# Patient Record
Sex: Male | Born: 1975 | Hispanic: No | Marital: Married | Smoking: Never smoker
Health system: Southern US, Community
[De-identification: ages and names within clinical notes are randomized; demographics above are authoritative.]

## PROBLEM LIST (undated history)

## (undated) ENCOUNTER — Emergency Department (HOSPITAL_BASED_OUTPATIENT_CLINIC_OR_DEPARTMENT_OTHER): Admission: EM | Payer: 59

---

## 2004-04-12 ENCOUNTER — Encounter: Admission: RE | Admit: 2004-04-12 | Discharge: 2004-04-12 | Payer: Self-pay | Admitting: Specialist

## 2014-10-14 ENCOUNTER — Ambulatory Visit (INDEPENDENT_AMBULATORY_CARE_PROVIDER_SITE_OTHER): Payer: BLUE CROSS/BLUE SHIELD | Admitting: Podiatry

## 2014-10-14 ENCOUNTER — Encounter: Payer: Self-pay | Admitting: Podiatry

## 2014-10-14 VITALS — BP 118/69 | HR 58 | Ht 65.0 in | Wt 172.0 lb

## 2014-10-14 DIAGNOSIS — L03032 Cellulitis of left toe: Secondary | ICD-10-CM | POA: Diagnosis not present

## 2014-10-14 DIAGNOSIS — D1800 Hemangioma unspecified site: Secondary | ICD-10-CM | POA: Insufficient documentation

## 2014-10-14 DIAGNOSIS — D1809 Hemangioma of other sites: Secondary | ICD-10-CM | POA: Diagnosis not present

## 2014-10-14 MED ORDER — CEPHALEXIN 500 MG PO CAPS: 500.0000 mg | ORAL_CAPSULE | Freq: Three times a day (TID) | ORAL | Status: AC

## 2014-10-14 NOTE — Progress Notes (Signed)
Subjective:  39 year old male presents stating that he has draining left great toe x 3 months. He was given a course of antibiotics by PCP. The drainage has not stopped. 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   Review of Systems - Negative except chief complaints.  Objective: Dermatologic: No open skin lesion visible. Serosanguinous draining toe nail from distal end nail bed left great toe. Vascular: All pedal pulses are palpable. No edema or erythema noted. No temperature changes noted. Orthopedic: Positive of excess sagittal plane motion of the first metatarsals bilateral. Limited dorsiflexion of the great toe upon loading of forefoot.  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 Hallux limitus bilateral. Hypermobile first ray bilateral.   Plan: Reviewed clinical findings and available treatment options. Patient elects to have the lesion removed today. Patient understand that nail plate also need be removed in order to excise the lesion that is under the nail bed.   Procedures done: 1. Removal of  nail plate left hallux. 2. Excision subungual growth left hallux, 0.3 x 1.2cm.  Local used: Total 5 ml of 50/50 mixture 0.5% Marcaine plain and 1% Xylocaine with epinephrine. Betadine prep used prior to incision.  Suture: 5/0 Nylon.  Dressing used with Amerigel ointment and compression dressing.  Keep the dressing intact and dry.  Return in one week.

## 2014-10-14 NOTE — Patient Instructions (Signed)
Left great toe lesion excised with nail avulsion. Keep the dressing dry till next week. Return in one week.

## 2014-10-17 ENCOUNTER — Encounter: Payer: Self-pay | Admitting: Podiatry

## 2014-10-17 ENCOUNTER — Ambulatory Visit (INDEPENDENT_AMBULATORY_CARE_PROVIDER_SITE_OTHER): Payer: BLUE CROSS/BLUE SHIELD | Admitting: Podiatry

## 2014-10-17 DIAGNOSIS — Z9889 Other specified postprocedural states: Secondary | ICD-10-CM

## 2014-10-17 NOTE — Progress Notes (Signed)
3 days post op. Patient came in because his dressing got soaked with blood. Wound examined. Much bleeding in dressing. Incision site is closed with no active bleeding. Wound cleansed with H2O2. Amerigel dressing applied. Home care instruction given.  Keep next appointment.

## 2014-10-17 NOTE — Patient Instructions (Signed)
Post op wound left great toe dressing changed. May take shower with dressing on. Remove dressing and dry well after each shower. Cleanse the wound with Hydrogen Peroxide or with Betadine solution and dry well.  Cover with antibiotic ointment dressing during the day.  May leave it open at night if it is not tender or has no drainage.

## 2014-10-21 ENCOUNTER — Encounter: Payer: BLUE CROSS/BLUE SHIELD | Admitting: Podiatry

## 2014-10-22 ENCOUNTER — Encounter: Payer: Self-pay | Admitting: Podiatry

## 2014-10-22 ENCOUNTER — Ambulatory Visit (INDEPENDENT_AMBULATORY_CARE_PROVIDER_SITE_OTHER): Payer: BLUE CROSS/BLUE SHIELD | Admitting: Podiatry

## 2014-10-22 DIAGNOSIS — Z9889 Other specified postprocedural states: Secondary | ICD-10-CM

## 2014-10-22 NOTE — Patient Instructions (Signed)
Post op wound healing. Sutures removed. Keep it covered with antibiotic dressing. Return in one week.

## 2014-10-22 NOTE — Progress Notes (Signed)
Post op left great toe following excision of subungual granuloma. Pathologic report reviewed. No abnormal malignant cell found. Toe is sore and difficult to walk. Wound is healing well. Sutures removed. Compression bandage applied. Home care instruction given.

## 2014-10-28 ENCOUNTER — Encounter: Payer: Self-pay | Admitting: Podiatry

## 2014-10-28 ENCOUNTER — Ambulatory Visit (INDEPENDENT_AMBULATORY_CARE_PROVIDER_SITE_OTHER): Payer: BLUE CROSS/BLUE SHIELD | Admitting: Podiatry

## 2014-10-28 DIAGNOSIS — Z9889 Other specified postprocedural states: Secondary | ICD-10-CM

## 2014-10-28 NOTE — Patient Instructions (Signed)
Post surgical nail bed healed well and dry. Return as needed.

## 2014-10-28 NOTE — Progress Notes (Signed)
Post surgical nail bed, excision of glomus tumor. Healed well. Nail bed is dry and clean. Return as needed.

## 2014-11-25 ENCOUNTER — Encounter: Payer: Self-pay | Admitting: *Deleted

## 2015-06-26 ENCOUNTER — Encounter: Payer: Self-pay | Admitting: Podiatry

## 2015-06-26 ENCOUNTER — Ambulatory Visit: Payer: BLUE CROSS/BLUE SHIELD | Admitting: Podiatry

## 2015-06-26 ENCOUNTER — Ambulatory Visit (INDEPENDENT_AMBULATORY_CARE_PROVIDER_SITE_OTHER): Payer: 59 | Admitting: Podiatry

## 2015-06-26 VITALS — BP 135/81 | HR 68

## 2015-06-26 DIAGNOSIS — M21969 Unspecified acquired deformity of unspecified lower leg: Secondary | ICD-10-CM | POA: Diagnosis not present

## 2015-06-26 DIAGNOSIS — M216X9 Other acquired deformities of unspecified foot: Secondary | ICD-10-CM | POA: Diagnosis not present

## 2015-06-26 DIAGNOSIS — M722 Plantar fascial fibromatosis: Secondary | ICD-10-CM

## 2015-06-26 DIAGNOSIS — B351 Tinea unguium: Secondary | ICD-10-CM

## 2015-06-26 NOTE — Patient Instructions (Signed)
Seen for abnormal nail on left great toe and heel pain. Deformed fungal infected nail from old surgical site debrided. May benefit from custom orthotics. Return for orthotic preparation.

## 2015-06-26 NOTE — Progress Notes (Signed)
Subjective: 40 year old male presents complaining of left big toe has thickness and discoloration, which is the same toe we done excision of glomus tumor from nail bed.  The left heel has sharp pain off and on.  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   Objective: Thick and dark abnormal nail at distal end of left great toe nail.  Neurovascular status are within normal.  Orthopedic: Elevated first ray bilateral. Forefoot varus bilateral. Healed left great toe lesion.  Assessment: S/P Excision glomus tumor left great toe nail bed. Onychomycosis left great toe. Forefoot varus with elevated first ray bilateral. Plantar fasciitis left.  Plan: Reviewed clinical findings and available treatment options. Left great toe nail debrided. May benefit from custom orthotics for left heel pain.

## 2015-07-07 ENCOUNTER — Encounter: Payer: Self-pay | Admitting: Podiatry

## 2015-07-07 ENCOUNTER — Ambulatory Visit (INDEPENDENT_AMBULATORY_CARE_PROVIDER_SITE_OTHER): Payer: 59 | Admitting: Podiatry

## 2015-07-07 VITALS — BP 128/71 | HR 68

## 2015-07-07 DIAGNOSIS — M216X9 Other acquired deformities of unspecified foot: Secondary | ICD-10-CM

## 2015-07-07 DIAGNOSIS — M21969 Unspecified acquired deformity of unspecified lower leg: Secondary | ICD-10-CM

## 2015-07-07 DIAGNOSIS — M722 Plantar fascial fibromatosis: Secondary | ICD-10-CM | POA: Diagnosis not present

## 2015-07-07 NOTE — Patient Instructions (Signed)
Both feet casted for orthotics. 

## 2015-07-07 NOTE — Progress Notes (Signed)
Subjective: 40 year old male presents complaining of left big toe has thickness and discoloration, which is the same toe we done excision of glomus tumor from nail bed.  The left heel has sharp pain off and on.  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  Objective: Thick and dark abnormal nail at distal end of left great toe nail.  Neurovascular status are within normal.  Orthopedic: Elevated first ray bilateral. Forefoot varus bilateral. Healed left great toe lesion.  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  Assessment: S/P Excision glomus tumor left great toe nail bed. Forefoot varus with elevated first ray bilateral. Plantar fasciitis left.  Plan: Reviewed clinical findings and available treatment options. Both feet casted for orthotics.

## 2015-10-21 ENCOUNTER — Ambulatory Visit: Payer: 59 | Admitting: Podiatry

## 2015-11-19 ENCOUNTER — Ambulatory Visit (INDEPENDENT_AMBULATORY_CARE_PROVIDER_SITE_OTHER): Payer: 59 | Admitting: Podiatry

## 2015-11-19 ENCOUNTER — Encounter: Payer: Self-pay | Admitting: Podiatry

## 2015-11-19 DIAGNOSIS — M79672 Pain in left foot: Secondary | ICD-10-CM

## 2015-11-19 DIAGNOSIS — B351 Tinea unguium: Secondary | ICD-10-CM

## 2015-11-19 NOTE — Patient Instructions (Signed)
Foot is doing better. Orthotics are helping while on feet at work. Pain while at home without shoes. Advised to get well supported sandals. Thick dystrophic mycotic nail on left great toe debrided. Return as needed.

## 2015-11-19 NOTE — Progress Notes (Signed)
Subjective: One month orthotic follow up. Foot is doing better. Orthotics are helping while on feet at work. Pain while at home without shoes. Patient points out painful left great toe with nail deformity.  Objective: Thick dystrophic nail with dorsal ridge at distal middle with fungal debris under nail plate left great toe.  Assessment: Improved plantar fasciitis with orthotic treatment. Onychomycosis with deformity left great toe.  Plan: Advised to get well supported sandals to wear at home. Thick dystrophic mycotic nail on left great toe debrided. Return as needed.

## 2016-01-17 ENCOUNTER — Encounter (HOSPITAL_COMMUNITY): Payer: Self-pay

## 2016-01-17 ENCOUNTER — Emergency Department (HOSPITAL_COMMUNITY)
Admission: EM | Admit: 2016-01-17 | Discharge: 2016-01-18 | Disposition: A | Discharge status: Home / Self Care | Payer: 59 | Attending: Emergency Medicine | Admitting: Emergency Medicine

## 2016-01-17 ENCOUNTER — Emergency Department (HOSPITAL_COMMUNITY): Payer: 59

## 2016-01-17 DIAGNOSIS — J40 Bronchitis, not specified as acute or chronic: Secondary | ICD-10-CM | POA: Diagnosis not present

## 2016-01-17 DIAGNOSIS — R55 Syncope and collapse: Secondary | ICD-10-CM | POA: Insufficient documentation

## 2016-01-17 LAB — CBG MONITORING, ED: Glucose-Capillary: 139 mg/dL — ABNORMAL HIGH (ref 65–99)

## 2016-01-17 MED ORDER — HYDROCODONE-HOMATROPINE 5-1.5 MG/5ML PO SYRP
5.0000 mL | ORAL_SOLUTION | Freq: Once | ORAL | Status: AC
  Administered 2016-01-18: 5 mL via ORAL
  Filled 2016-01-17: qty 5

## 2016-01-17 NOTE — ED Provider Notes (Signed)
[facility] DEPT Provider Note   CSN: [id number] Arrival date & time: 01/17/16  2321  By signing my name below, I, Chad Estes, attest that this documentation has been prepared under the direction and in the presence of [employer], PA-C. Electronically Signed: Dora Estes, Scribe. 01/17/2016. 11:47 PM.  History   Chief Complaint Chief Complaint  Patient presents with  . Loss of Consciousness    The history is provided by the patient and medical records. No language interpreter was used.     HPI Comments: Chad Estes is a 40 y.o. male brought in by family who presents to the Emergency Department complaining of a witnessed syncopal episode that occurred about an hour and a half ago. Patient reports he started having a "coughing fit" while leaning against a wall and suddenly lost consciousness; wife reports he landed on his left side and was unconscious for about 20-30 seconds. Pt reports he does not remember losing consciousness. He notes some mild facial, left shoulder, and left upper dental pain since the fall. He states he has had a persistent dry cough for about a week and has been coughing very hard. He notes some difficulty breathing when he has "coughing fits." He denies SOB when he is not coughing. No h/o syncope in the past. NKDA. He further denies CP, chest tightness, dysuria, seizures, tremors, fever, chills, nausea, vomiting, diarrhea, epistaxis, ear pain, or any other associated symptoms.  History reviewed. No pertinent past medical history.  Patient Active Problem List   Diagnosis Date Noted  . Status post nail surgery 10/17/2014  . Glomus tumor 10/14/2014  . Paronychia of great toe, left 10/14/2014    History reviewed. No pertinent surgical history.     Home Medications    Prior to Admission medications   Medication Sig Start Date End Date Taking? Authorizing Provider  cephALEXin (KEFLEX) 500 MG capsule Take 1 capsule (500 mg total) by mouth 3  (three) times daily. 10/14/14   Myeong O Sheard, DPM  HYDROcodone-homatropine (HYCODAN) 5-1.5 MG/5ML syrup Take 5 mLs by mouth every 6 (six) hours as needed for cough. 01/18/16   Audyn Dimercurio, PA-C  predniSONE (DELTASONE) 20 MG tablet Take 2 tablets (40 mg total) by mouth daily. 01/18/16   Jarrett Soho Gelisa Tieken, PA-C    Family History History reviewed. No pertinent family history.  Social History Social History  Substance Use Topics  . Smoking status: Never Smoker  . Smokeless tobacco: Never Used  . Alcohol use Not on file     Allergies   Patient has no known allergies.   Review of Systems Review of Systems  Constitutional: Negative for chills and fever.  HENT: Positive for dental problem (left upper). Negative for ear pain and nosebleeds.        Positive for facial pain.  Respiratory: Positive for cough and shortness of breath (only with cough). Negative for chest tightness.   Gastrointestinal: Negative for nausea and vomiting.  Musculoskeletal: Positive for myalgias (left shoulder).  Neurological: Positive for syncope (one episode). Negative for tremors and seizures.  All other systems reviewed and are negative.    Physical Exam Updated Vital Signs BP 163/100 (BP Location: Left Arm)   Pulse 77   Temp 98.5 F (36.9 C) (Oral)   Resp 17   Ht 5\' 7"  (1.702 m)   Wt 173 lb (78.5 kg)   SpO2 100%   BMI 27.10 kg/m   Physical Exam  Constitutional: He appears well-developed and well-nourished. No distress.  Awake, alert, nontoxic appearance  HENT:  Head: Normocephalic.  Mouth/Throat: Oropharynx is clear and moist. No oropharyngeal exudate.  Large contusion over the left zygomatic arch with tenderness to palpation of the left upper teeth. No visible fractures. No open wounds. No epistaxis.   Eyes: Conjunctivae and EOM are normal. Pupils are equal, round, and reactive to light. No scleral icterus.  Neck: Normal range of motion. Neck supple.  Cardiovascular: Normal  rate, regular rhythm and intact distal pulses.   Pulmonary/Chest: Effort normal. No respiratory distress. He has decreased breath sounds. He has no wheezes.  Equal chest expansion. Dry, hacking cough.  Abdominal: Soft. Bowel sounds are normal. He exhibits no mass. There is no tenderness. There is no rebound and no guarding.  Musculoskeletal: Normal range of motion. He exhibits no edema.  Left shoulder: decreased ROM due to pain. Tenderness to palpation over the [facility] joint with ecchymosis and swelling at the site. FROM of the left elbow and left wrist. Sensation intact to the left upper extremity. Strength 5/5 in LUE including grip strength.  Neurological: He is alert. No sensory deficit ( in BUE and BLE with normal touch). Gait normal. GCS eye subscore is 4. GCS verbal subscore is 5. GCS motor subscore is 6.  Speech is clear and goal oriented Moves extremities without ataxia Strength 5/5 in the BUE and BLE with c/o pain in the left shoulder during testing  Skin: Skin is warm and dry. He is not diaphoretic.  Psychiatric: He has a normal mood and affect.  Nursing note and vitals reviewed.    ED Treatments / Results  Labs (all labs ordered are listed, but only abnormal results are displayed) Labs Reviewed  BASIC METABOLIC PANEL - Abnormal; Notable for the following:       Result Value   Glucose, Bld 144 (*)    Calcium 8.8 (*)    All other components within normal limits  CBG MONITORING, ED - Abnormal; Notable for the following:    Glucose-Capillary 139 (*)    All other components within normal limits  CBC   ED ECG REPORT   Date: 01/18/2016  Rate: 80  Rhythm: normal sinus rhythm  QRS Axis: normal  Intervals: normal  ST/T Wave abnormalities: early repolarization  Conduction Disutrbances:none  Narrative Interpretation: NSR - ECG reviewed by Dr. Regenia Skeeter and myself  Old EKG Reviewed: none available  I have personally reviewed the EKG tracing and agree with the computerized printout  as noted.   Radiology Dg Chest 2 View  Result Date: 01/18/2016 CLINICAL DATA:  Patient passed out after coughing fit this evening EXAM: CHEST  2 VIEW COMPARISON:  04/12/2004 CXR FINDINGS: The heart size and mediastinal contours are within normal limits. Mild interstitial prominence suggesting bronchitic change. No alveolar consolidation, CHF nor effusion. The visualized skeletal structures are unremarkable. IMPRESSION: Mild increase in interstitial prominence suggesting bronchitic change. Electronically Signed   By: Ashley Royalty M.D.   On: 01/18/2016 00:35   Dg Shoulder Left  Result Date: 01/18/2016 CLINICAL DATA:  Pain after syncope EXAM: LEFT SHOULDER - 2+ VIEW COMPARISON:  None. FINDINGS: There is no evidence of fracture or dislocation. There is no evidence of arthropathy or other focal bone abnormality. Soft tissues are unremarkable. Visualized ribs and lung are without acute appearing abnormality. IMPRESSION: No acute osseous abnormality of the left shoulder. Electronically Signed   By: Ashley Royalty M.D.   On: 01/18/2016 00:37   Ct Maxillofacial Wo Contrast  Result Date: 01/18/2016 CLINICAL DATA:  Fall with impact to left  face. EXAM: CT MAXILLOFACIAL WITHOUT CONTRAST TECHNIQUE: Multidetector CT imaging of the maxillofacial structures was performed. Multiplanar CT image reconstructions were also generated. A small metallic BB was placed on the right temple in order to reliably differentiate right from left. COMPARISON:  None. FINDINGS: Osseous: --Complex facial fracture types: No LeFort, zygomaticomaxillary complex or nasoorbitoethmoidal fracture. --Simple fracture types: There is mild irregularity of the anterior wall of the left maxillary sinus, but this is in the location of symmetric bilateral nutrient foramina. Given that there is no other cortical discontinuity or displacement, this morphology is unlikely to indicate a true fracture. --Mandible, hard palate and teeth: No acute abnormality.  Orbits: The globes appear intact. Normal appearance of the intra- and extraconal fat. Symmetric extraocular muscles. Sinuses: There is intermediate attenuation material within the maxillary sinuses there is partial opacification of the right ethmoid air cells. Frontal sinuses clear. Minimal right sphenoid mucosal thickening. Mastoids are clear. Soft tissues: There is left facial soft tissue swelling involving the left nasolabial fold and left cheek. Limited intracranial: Normal. IMPRESSION: Left facial swelling without acute facial fracture. Electronically Signed   By: Ulyses Jarred M.D.   On: 01/18/2016 00:49    Procedures Procedures (including critical care time)  DIAGNOSTIC STUDIES: Oxygen Saturation is 100% on RA, normal by my interpretation.    COORDINATION OF CARE: 11:57 PM Discussed treatment plan with pt at bedside and pt agreed to plan.  Medications Ordered in ED Medications  albuterol (PROVENTIL HFA;VENTOLIN HFA) 108 (90 Base) MCG/ACT inhaler 2 puff (2 puffs Inhalation Canceled Entry 01/18/16 0211)  HYDROcodone-homatropine (HYCODAN) 5-1.5 MG/5ML syrup 5 mL (5 mLs Oral Given 01/18/16 0021)  AEROCHAMBER PLUS FLO-VU MEDIUM MISC 1 each (1 each Other Given 01/18/16 0140)     Initial Impression / Assessment and Plan / ED Course  I have reviewed the triage vital signs and the nursing notes.  Pertinent labs & imaging results that were available during my care of the patient were reviewed by me and considered in my medical decision making (see chart for details).  Clinical Course as of Jan 18 243  Mon Jan 18, 2016  0105 Vital signs stable. No tachycardia. No fever. Temp: 98.5 F (36.9 C) [HM]  0105 No leukocytosis WBC: 8.8 [HM]  0105 No facial fracture CT MAXILLOFACIAL WO CONTRAST [HM]  0105 No shoulder fracture DG Shoulder Left [HM]  0105 Bronchitic changes without pneumonia DG Chest 2 View [HM]  0106 Normal sinus rhythm, early repo pattern EKG 12-Lead [HM]    Clinical Course  User Index [HM] Quandra Fedorchak, PA-C   Bronchitis noted on chest x-ray. Patient given albuterol, steroids and cough suppression.  Patient without arrhythmia or tachycardia while here in the department.  Patient without history of congestive heart failure, normal hematocrit, normal ECG, no shortness of breath and systolic blood pressure greater than 90; patient is low risk. Suspect vasovagal syncope. Will plan for discharge home with close PCP follow-up.  Possibility of recurrent syncope has been discussed.   Pt has remained hemodynamically stable throughout their time in the ED  Orthostatic VS for the past 24 hrs:  BP- Lying Pulse- Lying BP- Sitting Pulse- Sitting BP- Standing at 0 minutes Pulse- Standing at 0 minutes  01/18/16 0212 143/80 85 154/84 88 146/86 100    Final Clinical Impressions(s) / ED Diagnoses   Final diagnoses:  Syncope and collapse  Bronchitis    New Prescriptions New Prescriptions   HYDROCODONE-HOMATROPINE (HYCODAN) 5-1.5 MG/5ML SYRUP    Take 5 mLs by  mouth every 6 (six) hours as needed for cough.   PREDNISONE (DELTASONE) 20 MG TABLET    Take 2 tablets (40 mg total) by mouth daily.   I personally performed the services described in this documentation, which was scribed in my presence. The recorded information has been reviewed and is accurate.    Jarrett Soho Chevy Virgo, PA-C [phone] [id number]    Delora Fuel, MD [phone]

## 2016-01-17 NOTE — ED Triage Notes (Signed)
Pt had a coughing fit this evening and subsequently passed out while leaning against the wall. Pt fell to tile floor on his face from standing position. Wife estimates that pt was unconscious for approx 20 secs. Pt A&Ox4 in triage, but does not remember passing out.

## 2016-01-18 ENCOUNTER — Emergency Department (HOSPITAL_COMMUNITY): Payer: 59

## 2016-01-18 LAB — BASIC METABOLIC PANEL
ANION GAP: 9 (ref 5–15)
BUN: 19 mg/dL (ref 6–20)
CALCIUM: 8.8 mg/dL — AB (ref 8.9–10.3)
CO2: 25 mmol/L (ref 22–32)
Chloride: 107 mmol/L (ref 101–111)
Creatinine, Ser: 0.88 mg/dL (ref 0.61–1.24)
GFR calc Af Amer: >60 mL/min (ref >60)
GLUCOSE: 144 mg/dL — AB (ref 65–99)
POTASSIUM: 4.1 mmol/L (ref 3.5–5.1)
SODIUM: 141 mmol/L (ref 135–145)

## 2016-01-18 LAB — CBC
HCT: 42.2 % (ref 39.0–52.0)
HEMOGLOBIN: 14.1 g/dL (ref 13.0–17.0)
MCH: 30.7 pg (ref 26.0–34.0)
MCHC: 33.4 g/dL (ref 30.0–36.0)
MCV: 91.9 fL (ref 78.0–100.0)
Platelets: 265 10*3/uL (ref 150–400)
RBC: 4.59 MIL/uL (ref 4.22–5.81)
RDW: 12 % (ref 11.5–15.5)
WBC: 8.8 10*3/uL (ref 4.0–10.5)

## 2016-01-18 MED ORDER — PREDNISONE 20 MG PO TABS: 40.0000 mg | ORAL_TABLET | Freq: Every day | ORAL | 0 refills | Status: AC

## 2016-01-18 MED ORDER — ALBUTEROL SULFATE HFA 108 (90 BASE) MCG/ACT IN AERS
2.0000 | INHALATION_SPRAY | RESPIRATORY_TRACT | Status: DC | PRN
  Administered 2016-01-18: 2 via RESPIRATORY_TRACT
  Filled 2016-01-18 (×2): qty 6.7

## 2016-01-18 MED ORDER — AEROCHAMBER PLUS FLO-VU MEDIUM MISC
1.0000 | Freq: Once | Status: AC
  Administered 2016-01-18: 1
  Filled 2016-01-18: qty 1

## 2016-01-18 MED ORDER — HYDROCODONE-HOMATROPINE 5-1.5 MG/5ML PO SYRP: 5.0000 mL | ORAL_SOLUTION | Freq: Four times a day (QID) | ORAL | 0 refills | Status: AC | PRN

## 2016-01-18 NOTE — Discharge Instructions (Signed)
1. Medications: Hycodan, albuterol, prednisone, usual home medications 2. Treatment: rest, drink plenty of fluids, take tylenol or ibuprofen for fever control 3. Follow Up: Please followup with your primary doctor in 3 days for discussion of your diagnoses and further evaluation after today's visit; if you do not have a primary care doctor use the resource guide provided to find one; Return to the ER for high fevers, difficulty breathing or other concerning symptoms

## 2016-10-17 ENCOUNTER — Ambulatory Visit: Payer: 59 | Admitting: Podiatry

## 2017-01-18 ENCOUNTER — Ambulatory Visit: Payer: 59 | Admitting: Podiatry

## 2017-09-27 IMAGING — CR DG CHEST 2V
2 series · 2 of 2 positions shown · non-contrast
Comparison: 04/12/2004 CXR

CLINICAL DATA: Patient passed out after coughing fit this evening

EXAM:
CHEST  2 VIEW

[w chest pa]
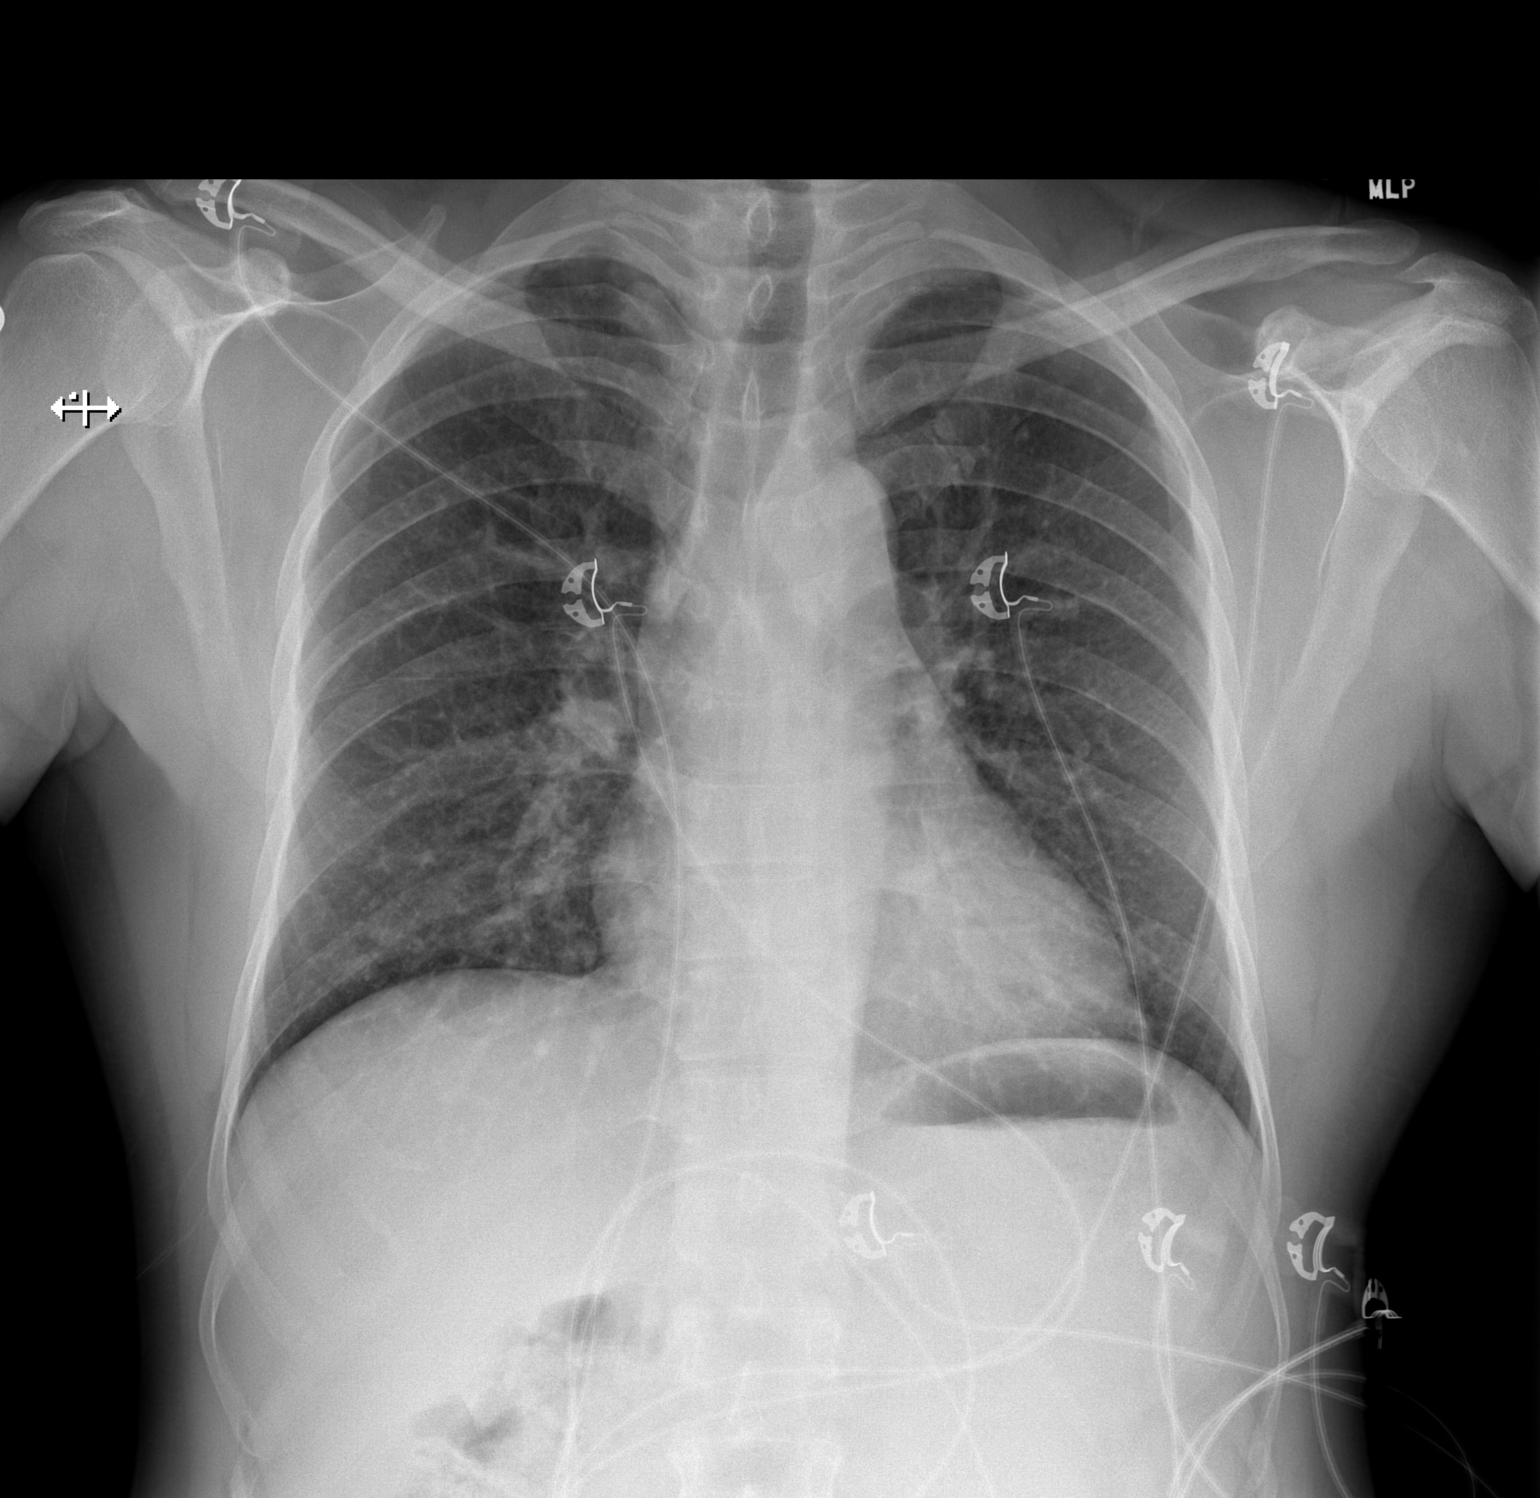

[w chest lat]
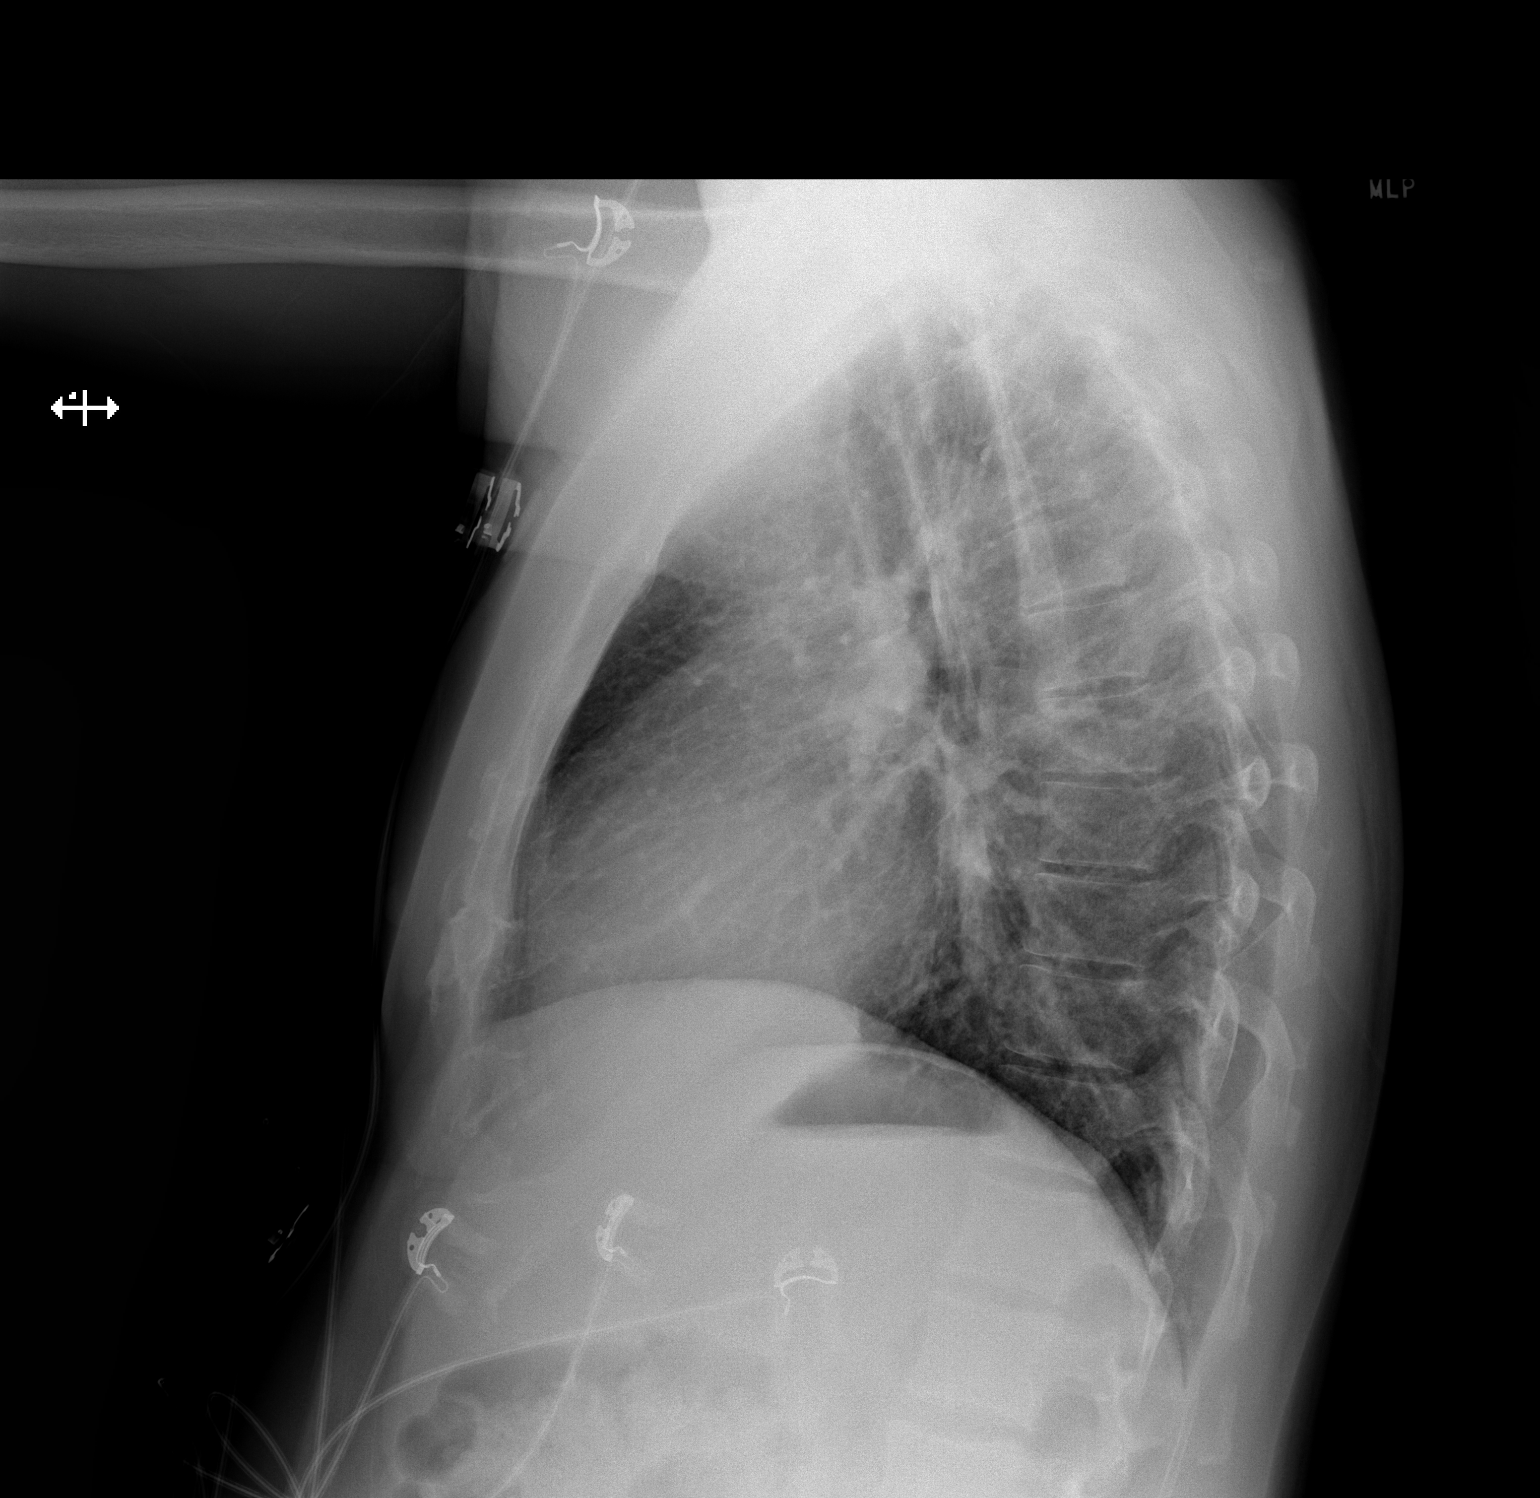

[2 of 2 positions shown; findings below may reference images not displayed]

FINDINGS: The heart size and mediastinal contours are within normal limits.
Mild interstitial prominence suggesting bronchitic change. No
alveolar consolidation, CHF nor effusion. The visualized skeletal
structures are unremarkable.
IMPRESSION: Mild increase in interstitial prominence suggesting bronchitic
change.

## 2021-04-08 DIAGNOSIS — J988 Other specified respiratory disorders: Secondary | ICD-10-CM | POA: Diagnosis not present

## 2021-04-08 DIAGNOSIS — J3489 Other specified disorders of nose and nasal sinuses: Secondary | ICD-10-CM | POA: Diagnosis not present

## 2021-04-08 DIAGNOSIS — J069 Acute upper respiratory infection, unspecified: Secondary | ICD-10-CM | POA: Diagnosis not present

## 2021-04-22 DIAGNOSIS — Z0000 Encounter for general adult medical examination without abnormal findings: Secondary | ICD-10-CM | POA: Diagnosis not present

## 2021-04-22 DIAGNOSIS — Z13220 Encounter for screening for lipoid disorders: Secondary | ICD-10-CM | POA: Diagnosis not present

## 2021-04-22 DIAGNOSIS — Z1159 Encounter for screening for other viral diseases: Secondary | ICD-10-CM | POA: Diagnosis not present

## 2021-04-22 DIAGNOSIS — Z131 Encounter for screening for diabetes mellitus: Secondary | ICD-10-CM | POA: Diagnosis not present

## 2021-04-22 DIAGNOSIS — Z23 Encounter for immunization: Secondary | ICD-10-CM | POA: Diagnosis not present

## 2021-04-22 DIAGNOSIS — E039 Hypothyroidism, unspecified: Secondary | ICD-10-CM | POA: Diagnosis not present

## 2022-04-25 DIAGNOSIS — Z130 Encounter for screening for diseases of the blood and blood-forming organs and certain disorders involving the immune mechanism: Secondary | ICD-10-CM | POA: Diagnosis not present

## 2022-04-25 DIAGNOSIS — Z13220 Encounter for screening for lipoid disorders: Secondary | ICD-10-CM | POA: Diagnosis not present

## 2022-04-25 DIAGNOSIS — E039 Hypothyroidism, unspecified: Secondary | ICD-10-CM | POA: Diagnosis not present

## 2022-04-25 DIAGNOSIS — Z0000 Encounter for general adult medical examination without abnormal findings: Secondary | ICD-10-CM | POA: Diagnosis not present

## 2023-05-04 DIAGNOSIS — Z13220 Encounter for screening for lipoid disorders: Secondary | ICD-10-CM | POA: Diagnosis not present

## 2023-05-04 DIAGNOSIS — E039 Hypothyroidism, unspecified: Secondary | ICD-10-CM | POA: Diagnosis not present

## 2023-05-04 DIAGNOSIS — Z130 Encounter for screening for diseases of the blood and blood-forming organs and certain disorders involving the immune mechanism: Secondary | ICD-10-CM | POA: Diagnosis not present

## 2023-05-08 DIAGNOSIS — Z0000 Encounter for general adult medical examination without abnormal findings: Secondary | ICD-10-CM | POA: Diagnosis not present

## 2023-05-08 DIAGNOSIS — Z23 Encounter for immunization: Secondary | ICD-10-CM | POA: Diagnosis not present

## 2023-05-08 DIAGNOSIS — M7712 Lateral epicondylitis, left elbow: Secondary | ICD-10-CM | POA: Diagnosis not present

## 2023-05-08 DIAGNOSIS — E039 Hypothyroidism, unspecified: Secondary | ICD-10-CM | POA: Diagnosis not present

## 2023-05-08 DIAGNOSIS — B351 Tinea unguium: Secondary | ICD-10-CM | POA: Diagnosis not present

## 2023-05-18 ENCOUNTER — Encounter: Payer: Self-pay | Admitting: Podiatry

## 2023-05-18 ENCOUNTER — Ambulatory Visit: Admitting: Podiatry

## 2023-05-18 VITALS — Ht 67.0 in | Wt 174.0 lb

## 2023-05-18 DIAGNOSIS — B351 Tinea unguium: Secondary | ICD-10-CM

## 2023-05-18 NOTE — Progress Notes (Signed)
  Subjective:  Patient ID: Chad Estes, male    DOB: 1975-08-02,  MRN: [id number]  Chief Complaint  Patient presents with   Nail Problem    new pt-left hallux toenail pain with possible onychomycosis    Discussed the use of AI scribe software for clinical note transcription with the patient, who gave verbal consent to proceed.  History of Present Illness Chad Estes is a 48 year old male who presents with toenail discoloration and discomfort. He was referred by his primary care physician after a physical examination.  He has a history of toenail issues dating back to 2016, when he experienced bleeding and pain in the toenail area. At that time, an ingrown toenail was removed, providing some relief.  Currently, he experiences occasional discomfort and tingling sensations in the affected toenail, particularly at night. The toenail has changed color and sometimes produces a white, crumbly substance when cut, which he describes as 'dead stuff'.  No pain upon direct pressure to the toenail, but he mentions mild discomfort when pressure is applied to the knee. He is concerned about the possibility of the condition recurring or worsening.  He is not on any medications and reports no significant medical issues. He recently had lab work done, which was mostly normal except for a slightly elevated TSH level, which has been stable over time.      Objective:    Physical Exam VASCULAR: DP and PT pulse palpable. Foot is warm and well-perfused. Capillary fill time is brisk. DERMATOLOGIC: Left hallux nail with dystrophic onycholysis, discoloration, and white subungual debris in the left distal lateral quadrant. NEUROLOGIC: Normal sensation to light touch and pressure. No paresthesias. ORTHOPEDIC: Smooth pain-free range of motion of all examined joints. No ecchymosis or bruising. No gross deformity. No pain to palpation.    No images are attached to the encounter.    Results Procedure: Toenail  Clipping and Debridement Description: Clipped the left hallux nail. Removed white subungual debris. Sent sample to the lab for testing.  Labs: TSH: 10   Assessment:   1. Onychomycosis      Plan:  Patient was evaluated and treated and all questions answered.  Assessment and Plan Assessment & Plan Onychomycosis Possible onychomycosis of the left hallux with dystrophic onycholysis, discoloration, and white subungual debris. Symptoms include tingling and discomfort, with previous ingrown toenail treatment. Differential diagnosis includes nail fungus or residual damage from prior toenail injury. - Obtain nail culture from left hallux to test for fungal infection - Inform him of results via MyChart in approximately two weeks - Advise him to file nail to keep it smooth and let it grow out - Discuss treatment options based on culture results      Return if symptoms worsen or fail to improve.

## 2023-05-18 NOTE — Patient Instructions (Signed)
 VISIT SUMMARY:  You came in today because of toenail discoloration and discomfort. You have a history of toenail issues since 2016, including an ingrown toenail and a node removal. Currently, you experience occasional discomfort and tingling in the affected toenail, especially at night. The toenail has changed color and sometimes produces a white, crumbly substance when cut. You are concerned about the condition recurring or worsening. Your recent lab work was mostly normal except for a slightly elevated TSA level, which has been stable over time.  YOUR PLAN:  -ONYCHOMYCOSIS: Onychomycosis is a fungal infection of the toenail that can cause discoloration, thickening, and debris under the nail. We will obtain a nail culture from your left big toe to test for a fungal infection. You will be informed of the results via MyChart in approximately two weeks. In the meantime, please file the nail to keep it smooth and let it grow out. We will discuss treatment options based on the culture results.  INSTRUCTIONS:  We will inform you of the nail culture results via MyChart in approximately two weeks. Please follow the advice to file the nail and let it grow out. We will discuss further treatment options based on the culture results.

## 2023-06-01 ENCOUNTER — Other Ambulatory Visit: Payer: Self-pay | Admitting: Podiatry

## 2023-06-11 ENCOUNTER — Ambulatory Visit: Payer: Self-pay | Admitting: Podiatry
